# Patient Record
Sex: Male | Born: 1970 | Hispanic: Yes | Smoking: Current every day smoker
Health system: Southern US, Community
[De-identification: ages and names within clinical notes are randomized; demographics above are authoritative.]

## PROBLEM LIST (undated history)

## (undated) DIAGNOSIS — E119 Type 2 diabetes mellitus without complications: Secondary | ICD-10-CM

---

## 2014-12-12 ENCOUNTER — Emergency Department (HOSPITAL_COMMUNITY): Payer: Self-pay

## 2014-12-12 ENCOUNTER — Encounter (HOSPITAL_COMMUNITY): Payer: Self-pay | Admitting: Emergency Medicine

## 2014-12-12 ENCOUNTER — Emergency Department (HOSPITAL_COMMUNITY)
Admission: EM | Admit: 2014-12-12 | Discharge: 2014-12-12 | Disposition: A | Discharge status: Home / Self Care | Payer: Self-pay | Attending: Emergency Medicine | Admitting: Emergency Medicine

## 2014-12-12 DIAGNOSIS — Y998 Other external cause status: Secondary | ICD-10-CM | POA: Insufficient documentation

## 2014-12-12 DIAGNOSIS — R739 Hyperglycemia, unspecified: Secondary | ICD-10-CM | POA: Insufficient documentation

## 2014-12-12 DIAGNOSIS — Y9389 Activity, other specified: Secondary | ICD-10-CM | POA: Insufficient documentation

## 2014-12-12 DIAGNOSIS — S299XXA Unspecified injury of thorax, initial encounter: Secondary | ICD-10-CM | POA: Insufficient documentation

## 2014-12-12 DIAGNOSIS — Y92410 Unspecified street and highway as the place of occurrence of the external cause: Secondary | ICD-10-CM | POA: Insufficient documentation

## 2014-12-12 DIAGNOSIS — S022XXA Fracture of nasal bones, initial encounter for closed fracture: Secondary | ICD-10-CM | POA: Insufficient documentation

## 2014-12-12 DIAGNOSIS — S0003XA Contusion of scalp, initial encounter: Secondary | ICD-10-CM | POA: Insufficient documentation

## 2014-12-12 DIAGNOSIS — S0292XA Unspecified fracture of facial bones, initial encounter for closed fracture: Secondary | ICD-10-CM

## 2014-12-12 DIAGNOSIS — F10920 Alcohol use, unspecified with intoxication, uncomplicated: Secondary | ICD-10-CM

## 2014-12-12 DIAGNOSIS — S023XXA Fracture of orbital floor, initial encounter for closed fracture: Secondary | ICD-10-CM | POA: Insufficient documentation

## 2014-12-12 DIAGNOSIS — F10120 Alcohol abuse with intoxication, uncomplicated: Secondary | ICD-10-CM | POA: Insufficient documentation

## 2014-12-12 HISTORY — DX: Type 2 diabetes mellitus without complications: E11.9

## 2014-12-12 LAB — COMPREHENSIVE METABOLIC PANEL
ALBUMIN: 4 g/dL (ref 3.5–5.0)
ALK PHOS: 84 U/L (ref 38–126)
ALT: 57 U/L (ref 17–63)
ANION GAP: 10 (ref 5–15)
AST: 53 U/L — ABNORMAL HIGH (ref 15–41)
BILIRUBIN TOTAL: 0.6 mg/dL (ref 0.3–1.2)
BUN: 13 mg/dL (ref 6–20)
CALCIUM: 8.5 mg/dL — AB (ref 8.9–10.3)
CO2: 22 mmol/L (ref 22–32)
Chloride: 103 mmol/L (ref 101–111)
Creatinine, Ser: 0.55 mg/dL — ABNORMAL LOW (ref 0.61–1.24)
GLUCOSE: 299 mg/dL — AB (ref 65–99)
Potassium: 3.7 mmol/L (ref 3.5–5.1)
Sodium: 135 mmol/L (ref 135–145)
TOTAL PROTEIN: 7.6 g/dL (ref 6.5–8.1)

## 2014-12-12 LAB — URINALYSIS, ROUTINE W REFLEX MICROSCOPIC
BILIRUBIN URINE: NEGATIVE
Glucose, UA: >1000 mg/dL — AB
KETONES UR: NEGATIVE mg/dL
Leukocytes, UA: NEGATIVE
Nitrite: NEGATIVE
PH: 6 (ref 5.0–8.0)
Protein, ur: NEGATIVE mg/dL
UROBILINOGEN UA: 0.2 mg/dL (ref 0.0–1.0)

## 2014-12-12 LAB — CBC WITH DIFFERENTIAL/PLATELET
Basophils Absolute: 0 10*3/uL (ref 0.0–0.1)
Basophils Relative: 0 % (ref 0–1)
Eosinophils Absolute: 0.2 10*3/uL (ref 0.0–0.7)
Eosinophils Relative: 1 % (ref 0–5)
HEMATOCRIT: 42.2 % (ref 39.0–52.0)
HEMOGLOBIN: 15 g/dL (ref 13.0–17.0)
LYMPHS ABS: 1.6 10*3/uL (ref 0.7–4.0)
LYMPHS PCT: 13 % (ref 12–46)
MCH: 33.6 pg (ref 26.0–34.0)
MCHC: 35.5 g/dL (ref 30.0–36.0)
MCV: 94.6 fL (ref 78.0–100.0)
MONO ABS: 0.9 10*3/uL (ref 0.1–1.0)
MONOS PCT: 7 % (ref 3–12)
NEUTROS ABS: 9.6 10*3/uL — AB (ref 1.7–7.7)
NEUTROS PCT: 79 % — AB (ref 43–77)
Platelets: 236 10*3/uL (ref 150–400)
RBC: 4.46 MIL/uL (ref 4.22–5.81)
RDW: 12.4 % (ref 11.5–15.5)
WBC: 12.2 10*3/uL — ABNORMAL HIGH (ref 4.0–10.5)

## 2014-12-12 LAB — URINE MICROSCOPIC-ADD ON

## 2014-12-12 LAB — ETHANOL: ALCOHOL ETHYL (B): 140 mg/dL — AB (ref <5)

## 2014-12-12 LAB — CBG MONITORING, ED: GLUCOSE-CAPILLARY: 313 mg/dL — AB (ref 65–99)

## 2014-12-12 MED ORDER — CEPHALEXIN 500 MG PO CAPS: 500.0000 mg | ORAL_CAPSULE | Freq: Four times a day (QID) | ORAL | Status: AC

## 2014-12-12 MED ORDER — IOHEXOL 300 MG/ML  SOLN
100.0000 mL | Freq: Once | INTRAMUSCULAR | Status: AC | PRN
  Administered 2014-12-12: 100 mL via INTRAVENOUS

## 2014-12-12 MED ORDER — HYDROCODONE-ACETAMINOPHEN 5-325 MG PO TABS: 2.0000 | ORAL_TABLET | ORAL | Status: AC | PRN

## 2014-12-12 MED ORDER — CEPHALEXIN 500 MG PO CAPS
500.0000 mg | ORAL_CAPSULE | Freq: Once | ORAL | Status: AC
  Administered 2014-12-12: 500 mg via ORAL
  Filled 2014-12-12: qty 1

## 2014-12-12 NOTE — ED Notes (Signed)
Notified RN,Allan pt. [phone] [phone].
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
## 2014-12-12 NOTE — ED Notes (Signed)
Pt. In xray 
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
## 2014-12-12 NOTE — ED Notes (Signed)
Pt.made aware for the need of urine. 
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
